# Patient Record
Sex: Male | Born: 1995 | Race: White | Hispanic: No | Marital: Single | Smoking: Current some day smoker
Health system: Southern US, Community
[De-identification: ages and names within clinical notes are randomized; demographics above are authoritative.]

## PROBLEM LIST (undated history)

## (undated) DIAGNOSIS — L709 Acne, unspecified: Secondary | ICD-10-CM

## (undated) DIAGNOSIS — J302 Other seasonal allergic rhinitis: Secondary | ICD-10-CM

## (undated) HISTORY — DX: Acne, unspecified: L70.9

## (undated) HISTORY — DX: Other seasonal allergic rhinitis: J30.2

---
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
## 2015-06-08 ENCOUNTER — Encounter: Payer: Self-pay | Admitting: Podiatry

## 2015-06-08 ENCOUNTER — Ambulatory Visit: Payer: BLUE CROSS/BLUE SHIELD | Admitting: Podiatry

## 2015-06-08 ENCOUNTER — Ambulatory Visit (INDEPENDENT_AMBULATORY_CARE_PROVIDER_SITE_OTHER): Payer: BLUE CROSS/BLUE SHIELD | Admitting: Podiatry

## 2015-06-08 VITALS — BP 120/80 | HR 88 | Resp 16

## 2015-06-08 DIAGNOSIS — L600 Ingrowing nail: Secondary | ICD-10-CM | POA: Diagnosis not present

## 2015-06-08 MED ORDER — NEOMYCIN-POLYMYXIN-HC 1 % OT SOLN: OTIC | Status: DC

## 2015-06-08 MED ORDER — HYDROCODONE-ACETAMINOPHEN 10-325 MG PO TABS: 1.0000 | ORAL_TABLET | Freq: Three times a day (TID) | ORAL | Status: DC | PRN

## 2015-06-08 NOTE — Progress Notes (Signed)
Subjective:     Patient ID: Stephen Williams, male   DOB: 06/15/1995, 20 y.o.   MRN: [id number]  HPI patient presents with his mother with chronic ingrown toenails and thick toenails of both feet that have been a problem for him. 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 They have opted for permanent procedures and if this does not work we'll need to consider suturing the areas closed. At this time I infiltrated each hallux 60 mg Xylocaine Marcaine mixture remove the hallux nails exposed the root and applied phenol 5 applications 30 seconds and applied sterile dressings to each hallux for Corticosporin otic solution and Vicodin 10/31/2023 #10 for postoperative care. Instructed on soaks and reappoint

## 2015-06-08 NOTE — Patient Instructions (Signed)

## 2015-06-16 ENCOUNTER — Telehealth: Payer: Self-pay | Admitting: *Deleted

## 2015-06-16 NOTE — Telephone Encounter (Signed)
Called patient at [phone] (Home #) to check to see how they were doing from their bilateral ingrown toenail procedure that was performed on Thursday, Jun 08, 2015. Patient's mother stated, "Son is doing fine and has no pain". I told her that if they have any questions or concerns to call our office back at any time. She stated she understood.

## 2015-07-17 ENCOUNTER — Telehealth: Payer: Self-pay | Admitting: Internal Medicine

## 2015-07-17 NOTE — Telephone Encounter (Signed)
Got scheduled  °

## 2015-07-17 NOTE — Telephone Encounter (Signed)
yes

## 2015-07-17 NOTE — Telephone Encounter (Signed)
Would like to know if Dr. Jones can take patient on.  °

## 2015-07-27 ENCOUNTER — Encounter: Payer: Self-pay | Admitting: Internal Medicine

## 2015-07-27 ENCOUNTER — Ambulatory Visit (INDEPENDENT_AMBULATORY_CARE_PROVIDER_SITE_OTHER): Payer: BLUE CROSS/BLUE SHIELD | Admitting: Internal Medicine

## 2015-07-27 ENCOUNTER — Other Ambulatory Visit (INDEPENDENT_AMBULATORY_CARE_PROVIDER_SITE_OTHER): Payer: BLUE CROSS/BLUE SHIELD

## 2015-07-27 VITALS — BP 118/80 | HR 77 | Temp 98.5°F | Resp 16 | Ht 69.0 in | Wt 156.0 lb

## 2015-07-27 DIAGNOSIS — Z0000 Encounter for general adult medical examination without abnormal findings: Secondary | ICD-10-CM

## 2015-07-27 DIAGNOSIS — F900 Attention-deficit hyperactivity disorder, predominantly inattentive type: Secondary | ICD-10-CM | POA: Insufficient documentation

## 2015-07-27 LAB — CBC WITH DIFFERENTIAL/PLATELET
BASOS ABS: 0.1 10*3/uL (ref 0.0–0.1)
Basophils Relative: 0.8 % (ref 0.0–3.0)
EOS ABS: 0.2 10*3/uL (ref 0.0–0.7)
Eosinophils Relative: 3 % (ref 0.0–5.0)
HCT: 46.2 % (ref 36.0–49.0)
Hemoglobin: 15.4 g/dL (ref 12.0–16.0)
LYMPHS ABS: 2.2 10*3/uL (ref 0.7–4.0)
Lymphocytes Relative: 31.5 % (ref 24.0–48.0)
MCHC: 33.4 g/dL (ref 31.0–37.0)
MCV: 89.7 fl (ref 78.0–98.0)
MONOS PCT: 9.3 % (ref 3.0–12.0)
Monocytes Absolute: 0.6 10*3/uL (ref 0.1–1.0)
NEUTROS ABS: 3.8 10*3/uL (ref 1.4–7.7)
NEUTROS PCT: 55.4 % (ref 43.0–71.0)
PLATELETS: 209 10*3/uL (ref 150.0–575.0)
RBC: 5.15 Mil/uL (ref 3.80–5.70)
RDW: 13.4 % (ref 11.4–15.5)
WBC: 6.8 10*3/uL (ref 4.5–13.5)

## 2015-07-27 LAB — LIPID PANEL
CHOL/HDL RATIO: 4
Cholesterol: 200 mg/dL (ref 0–200)
HDL: 49.5 mg/dL (ref >39.00)
LDL Cholesterol: 136 mg/dL — ABNORMAL HIGH (ref 0–99)
NonHDL: 150.25
TRIGLYCERIDES: 71 mg/dL (ref 0.0–149.0)
VLDL: 14.2 mg/dL (ref 0.0–40.0)

## 2015-07-27 LAB — COMPREHENSIVE METABOLIC PANEL
ALK PHOS: 74 U/L (ref 52–171)
ALT: 13 U/L (ref 0–53)
AST: 12 U/L (ref 0–37)
Albumin: 4.7 g/dL (ref 3.5–5.2)
BILIRUBIN TOTAL: 0.8 mg/dL (ref 0.2–1.2)
BUN: 12 mg/dL (ref 6–23)
CALCIUM: 10.4 mg/dL (ref 8.4–10.5)
CO2: 31 meq/L (ref 19–32)
CREATININE: 0.89 mg/dL (ref 0.40–1.50)
Chloride: 104 mEq/L (ref 96–112)
GFR: 116.26 mL/min (ref >60.00)
GLUCOSE: 89 mg/dL (ref 70–99)
Potassium: 4.4 mEq/L (ref 3.5–5.1)
Sodium: 141 mEq/L (ref 135–145)
TOTAL PROTEIN: 7.6 g/dL (ref 6.0–8.3)

## 2015-07-27 LAB — TSH: TSH: 1.53 u[IU]/mL (ref 0.40–5.00)

## 2015-07-27 MED ORDER — AMPHETAMINE-DEXTROAMPHET ER 20 MG PO CP24: 20.0000 mg | ORAL_CAPSULE | ORAL | Status: DC

## 2015-07-27 NOTE — Patient Instructions (Signed)

## 2015-07-27 NOTE — Progress Notes (Signed)
Subjective:  Patient ID: Stephen Williams, male    DOB: 06/12/1995  Age: 20 y.o. MRN: [id number]  CC: ADHD and Annual Exam  NEW TO ME  HPI Stephen EgeJoseph Andrew Russman presents for a CPX.  He requests to be treated for ADHD with poor attention. See scanned documents from [facility] that document that he has ADHD and may benefit from medication. He tells me that a few months ago he tried to friends dose of Adderall and he tells me that it helped him quite a bit. 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 He has not decided on a major yet and says his grades are "okay".  History Stephen Williams has a past medical history of Acne and Seasonal allergies.   He has no past surgical history on file.   His family history includes Cancer in his paternal grandfather.He reports that he has been smoking.  He has never used smokeless tobacco. He reports that he does not drink alcohol or use illicit drugs.  Outpatient Prescriptions Prior to Visit  Medication Sig Dispense Refill  . HYDROcodone-acetaminophen (NORCO) 10-325 MG tablet Take 1 tablet by mouth every 8 (eight) hours as needed. (Patient not taking: Reported on 07/27/2015) 10 tablet 0  . NEOMYCIN-POLYMYXIN-HYDROCORTISONE (CORTISPORIN) 1 % SOLN otic solution Apply 1-2 drops to toe BID after soaking (Patient not taking: Reported on 07/27/2015) 10 mL 1   No facility-administered medications prior to visit.    ROS Review of Systems  Constitutional: Negative.  Negative for activity change, appetite change, fatigue and unexpected weight change.  HENT: Negative.   Eyes: Negative.   Respiratory: Negative.  Negative for cough and shortness of breath.   Cardiovascular: Negative.   Gastrointestinal: Negative.  Negative for abdominal pain.  Endocrine: Negative.   Genitourinary: Negative.  Negative for dysuria, urgency, decreased urine  volume, discharge, penile swelling, scrotal swelling, difficulty urinating, genital sores, penile pain and testicular pain.  Musculoskeletal: Negative.   Skin: Negative.  Negative for rash.  Allergic/Immunologic: Negative.   Neurological: Negative.   Hematological: Negative.   Psychiatric/Behavioral: Positive for decreased concentration. Negative for suicidal ideas, hallucinations, behavioral problems, confusion, sleep disturbance, self-injury, dysphoric mood and agitation. The patient is not nervous/anxious and is not hyperactive.     Objective:  BP 118/80 mmHg  Pulse 77  Temp(Src) 98.5 F (36.9 C) (Oral)  Resp 16  Ht 5\' 9"  (1.753 m)  Wt 156 lb (70.761 kg)  BMI 23.03 kg/m2  SpO2 96%  Physical Exam  Constitutional: He is oriented to person, place, and time. No distress.  HENT:  Mouth/Throat: Oropharynx is clear and moist. No oropharyngeal exudate.  Eyes: Conjunctivae are normal. Right eye exhibits no discharge. Left eye exhibits no discharge. No scleral icterus.  Neck: Normal range of motion. Neck supple. No JVD present. No tracheal deviation present. No thyromegaly present.  Cardiovascular: Normal rate, regular rhythm, normal heart sounds and intact distal pulses.  Exam reveals no gallop and no friction rub.   No murmur heard. Pulmonary/Chest: Effort normal. No respiratory distress. He has no wheezes. He has no rales. He exhibits no tenderness.  Abdominal: Soft. Bowel sounds are normal. He exhibits no distension and no mass. There is no tenderness. There is no rebound and no guarding. Hernia confirmed negative in the right inguinal area and confirmed negative in the left inguinal area.  Genitourinary: Testes normal and penis normal. Right  testis shows no mass, no swelling and no tenderness. Right testis is descended. Left testis shows no mass, no swelling and no tenderness. Left testis is descended. Circumcised. No penile erythema or penile tenderness. No discharge found.    Musculoskeletal: Normal range of motion. He exhibits no edema or tenderness.  Lymphadenopathy:    He has no cervical adenopathy.       Right: No inguinal adenopathy present.       Left: No inguinal adenopathy present.  Neurological: He is oriented to person, place, and time.  Skin: Skin is warm and dry. No rash noted. He is not diaphoretic. No erythema. No pallor.  Psychiatric: He has a normal mood and affect. His behavior is normal. Judgment and thought content normal.  Vitals reviewed.    Lab Results  Component Value Date   WBC 6.8 07/27/2015   HGB 15.4 07/27/2015   HCT 46.2 07/27/2015   PLT 209.0 07/27/2015   GLUCOSE 89 07/27/2015   CHOL 200 07/27/2015   TRIG 71.0 07/27/2015   HDL 49.50 07/27/2015   LDLCALC 136* 07/27/2015   ALT 13 07/27/2015   AST 12 07/27/2015   NA 141 07/27/2015   K 4.4 07/27/2015   CL 104 07/27/2015   CREATININE 0.89 07/27/2015   BUN 12 07/27/2015   CO2 31 07/27/2015   TSH 1.53 07/27/2015   Assessment & Plan:   Stephen Williams was seen today for adhd and annual exam.  Diagnoses and all orders for this visit:  Attention deficit hyperactivity disorder (ADHD), predominantly inattentive type -     Discontinue: amphetamine-dextroamphetamine (ADDERALL XR) 20 MG 24 hr capsule; Take 1 capsule (20 mg total) by mouth every morning. -     Discontinue: amphetamine-dextroamphetamine (ADDERALL XR) 20 MG 24 hr capsule; Take 1 capsule (20 mg total) by mouth every morning. -     Discontinue: amphetamine-dextroamphetamine (ADDERALL XR) 20 MG 24 hr capsule; Take 1 capsule (20 mg total) by mouth every morning. -     amphetamine-dextroamphetamine (ADDERALL XR) 20 MG 24 hr capsule; Take 1 capsule (20 mg total) by mouth every morning.  Routine general medical examination at a health care facility- hand complete, labs ordered and reviewed, vaccines were reviewed, patient education material was given. -     Lipid panel; Future -     Comprehensive metabolic panel; Future -      CBC with Differential/Platelet; Future -     TSH; Future -     HIV antibody; Future  I have discontinued Mr. Louie Williams's NEOMYCIN-POLYMYXIN-HYDROCORTISONE and HYDROcodone-acetaminophen. I am also having him maintain his amphetamine-dextroamphetamine.  Meds ordered this encounter  Medications  . DISCONTD: amphetamine-dextroamphetamine (ADDERALL XR) 20 MG 24 hr capsule    Sig: Take 1 capsule (20 mg total) by mouth every morning.    Dispense:  30 capsule    Refill:  0    FILL ON OR AFTER 07/27/15  . DISCONTD: amphetamine-dextroamphetamine (ADDERALL XR) 20 MG 24 hr capsule    Sig: Take 1 capsule (20 mg total) by mouth every morning.    Dispense:  30 capsule    Refill:  0    FILL ON OR AFTER 08/26/15  . DISCONTD: amphetamine-dextroamphetamine (ADDERALL XR) 20 MG 24 hr capsule    Sig: Take 1 capsule (20 mg total) by mouth every morning.    Dispense:  30 capsule    Refill:  0    FILL ON OR AFTER 09/26/15  . amphetamine-dextroamphetamine (ADDERALL XR) 20 MG 24 hr capsule  Sig: Take 1 capsule (20 mg total) by mouth every morning.    Dispense:  30 capsule    Refill:  0    FILL ON OR AFTER 10/27/15     Follow-up: Return if symptoms worsen or fail to improve.  Sanda Linger, MD

## 2015-07-27 NOTE — Progress Notes (Signed)
Pre visit review using our clinic review tool, if applicable. No additional management support is needed unless otherwise documented below in the visit note. 

## 2015-07-28 ENCOUNTER — Encounter: Payer: Self-pay | Admitting: Internal Medicine

## 2015-07-28 LAB — HIV ANTIBODY (ROUTINE TESTING W REFLEX): HIV: NONREACTIVE

## 2015-08-02 ENCOUNTER — Encounter: Payer: Self-pay | Admitting: Internal Medicine

## 2015-10-19 ENCOUNTER — Ambulatory Visit (INDEPENDENT_AMBULATORY_CARE_PROVIDER_SITE_OTHER): Payer: BLUE CROSS/BLUE SHIELD | Admitting: Internal Medicine

## 2015-10-19 ENCOUNTER — Encounter: Payer: Self-pay | Admitting: Internal Medicine

## 2015-10-19 VITALS — BP 128/70 | HR 89 | Temp 98.7°F | Wt 147.0 lb

## 2015-10-19 DIAGNOSIS — K5900 Constipation, unspecified: Secondary | ICD-10-CM | POA: Insufficient documentation

## 2015-10-19 DIAGNOSIS — F900 Attention-deficit hyperactivity disorder, predominantly inattentive type: Secondary | ICD-10-CM

## 2015-10-19 DIAGNOSIS — K645 Perianal venous thrombosis: Secondary | ICD-10-CM | POA: Insufficient documentation

## 2015-10-19 MED ORDER — AMPHETAMINE-DEXTROAMPHET ER 20 MG PO CP24: 20.0000 mg | ORAL_CAPSULE | ORAL | 0 refills | Status: DC

## 2015-10-19 MED ORDER — LIDOCAINE-HYDROCORTISONE ACE 3-0.5 % RE CREA: 1.0000 | TOPICAL_CREAM | Freq: Two times a day (BID) | RECTAL | 1 refills | Status: DC

## 2015-10-19 NOTE — Patient Instructions (Signed)
Please take all new medication as prescribed - the anamantle HC cream for anti-inflammation and pain  OK to start the miralax 17 gm by mouth daily, as well as the Colace 100 mg twice per day as needed for softening  Please continue all other medications as before, and refills have been done if requested - the adderall  Please have the pharmacy call with any other refills you may need.  Please keep your appointments with your specialists as you may have planned

## 2015-10-19 NOTE — Progress Notes (Signed)
   Subjective:    Patient ID: Andres EgeJoseph Andrew Eudy, male    DOB: 02/26/1995, 20 y.o.   MRN: [id number]  HPI  Here with mother, c/o 4 days onset rectal pain, seemed to start with assoc with strain at stool related to recent flare of acute on chronic constipation, pain some improved today but still 3/10, no bleeding or other abd pain, n/v, fever, drainage or hx of same.  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 No wt loss or other side effect. Past Medical History:  Diagnosis Date  . Acne   . Seasonal allergies    No past surgical history on file.  reports that he has been smoking.  He has never used smokeless tobacco. He reports that he does not drink alcohol or use drugs. family history includes Cancer in his paternal grandfather. No Known Allergies No current outpatient prescriptions on file prior to visit.   No current facility-administered medications on file prior to visit.    Review of Systems  Constitutional: Negative for unusual diaphoresis or night sweats HENT: Negative for ear swelling or discharge Eyes: Negative for worsening visual haziness  Respiratory: Negative for choking and stridor.   Gastrointestinal: Negative for distension or worsening eructation Genitourinary: Negative for retention or change in urine volume.  Musculoskeletal: Negative for other MSK pain or swelling Skin: Negative for color change and worsening wound Neurological: Negative for tremors and numbness other than noted  Psychiatric/Behavioral: Negative for decreased concentration or agitation other than above       Objective:   Physical Exam BP 128/70   Pulse 89   Temp 98.7 F (37.1 C) (Oral)   Wt 147 lb (66.7 kg)   SpO2 96%   BMI 21.71 kg/m  VS noted,  Constitutional: Pt appears in no apparent distress HENT: Head: NCAT.  Right Ear: External ear normal.  Left Ear: External ear normal.  Eyes:  . Pupils are equal, round, and reactive to light. Conjunctivae and EOM are normal Neck: Normal range of motion. Neck supple.  Cardiovascular: Normal rate and regular rhythm.   Pulmonary/Chest: Effort normal and breath sounds without rales or wheezing.  Abd:  Soft, NT, ND, + BS DRE with 4oclock 15 mm thrombosed ext hemorrhoid with slight blood 2mm area nonactive bleeding Neurological: Pt is alert. Not confused , motor grossly intact Skin: Skin is warm. No rash, no LE edema Psychiatric: Pt behavior is normal. No agitation.     Assessment & Plan:

## 2015-10-19 NOTE — Progress Notes (Signed)
Pre visit review using our clinic review tool, if applicable. No additional management support is needed unless otherwise documented below in the visit note. 

## 2015-10-20 ENCOUNTER — Ambulatory Visit: Payer: BLUE CROSS/BLUE SHIELD | Admitting: Internal Medicine

## 2015-10-21 NOTE — Assessment & Plan Note (Signed)
Mild to mod, for anamantle HC prn,,  to f/u any worsening symptoms or concerns

## 2015-10-21 NOTE — Assessment & Plan Note (Addendum)
Ok for refill current med,  No evidence for diversion or misuse, to f/u any worsening symptoms or concerns

## 2015-10-21 NOTE — Assessment & Plan Note (Signed)
Acute on chronic recurring, for miralax daily restart

## 2016-01-11 ENCOUNTER — Other Ambulatory Visit: Payer: Self-pay | Admitting: Internal Medicine

## 2016-01-11 ENCOUNTER — Telehealth: Payer: Self-pay | Admitting: *Deleted

## 2016-01-11 DIAGNOSIS — F900 Attention-deficit hyperactivity disorder, predominantly inattentive type: Secondary | ICD-10-CM

## 2016-01-11 MED ORDER — AMPHETAMINE-DEXTROAMPHET ER 20 MG PO CP24: 20.0000 mg | ORAL_CAPSULE | ORAL | 0 refills | Status: DC

## 2016-01-11 NOTE — Telephone Encounter (Signed)
done

## 2016-01-11 NOTE — Telephone Encounter (Signed)
Notified pt mom rx ready for pick-up.../lmb 

## 2016-01-11 NOTE — Telephone Encounter (Signed)
Rec'd call from pt mom son needing refill on Adderrall...Raechel Chute/lmb

## 2016-03-07 ENCOUNTER — Other Ambulatory Visit: Payer: Self-pay | Admitting: Internal Medicine

## 2016-03-07 ENCOUNTER — Telehealth: Payer: Self-pay | Admitting: *Deleted

## 2016-03-07 DIAGNOSIS — F900 Attention-deficit hyperactivity disorder, predominantly inattentive type: Secondary | ICD-10-CM

## 2016-03-07 MED ORDER — AMPHETAMINE-DEXTROAMPHET ER 20 MG PO CP24: 20.0000 mg | ORAL_CAPSULE | ORAL | 0 refills | Status: DC

## 2016-03-07 NOTE — Telephone Encounter (Signed)
Notified pt mom rx's ready for pick-up...Stephen Williams/lmb

## 2016-03-07 NOTE — Telephone Encounter (Signed)
Rec'd call from pt mom requesting refills on Adderrall for son...Stephen Williams/lmb

## 2016-03-07 NOTE — Telephone Encounter (Signed)
done

## 2016-06-10 ENCOUNTER — Ambulatory Visit (INDEPENDENT_AMBULATORY_CARE_PROVIDER_SITE_OTHER): Payer: BLUE CROSS/BLUE SHIELD | Admitting: Family

## 2016-06-10 ENCOUNTER — Encounter: Payer: Self-pay | Admitting: Family

## 2016-06-10 DIAGNOSIS — F900 Attention-deficit hyperactivity disorder, predominantly inattentive type: Secondary | ICD-10-CM

## 2016-06-10 MED ORDER — AMPHETAMINE-DEXTROAMPHET ER 20 MG PO CP24: 20.0000 mg | ORAL_CAPSULE | ORAL | 0 refills | Status: DC

## 2016-06-10 NOTE — Progress Notes (Signed)
Subjective:    Patient ID: Stephen Williams, male    DOB: December 23, 1995, 20 y.o.   MRN: [id number]  Chief Complaint  Patient presents with  . Medication Refill    needs refill of adderall    HPI:  Stephen Williams is a 21 y.o. male who  has a past medical history of Acne and Seasonal allergies. and presents today for an a follow up office visit.  1.) ADHD - Currently maintained on Adderall and reports taking the medication as prescribed and denies adverse side effects. Concentration and symptoms are generally well controlled with the current dosage of medication. Sleeping approximately 6-8 hours per night. No significant changes to appetite or weight. No headaches, chest pain, shortness of breath, or heart palpitations.   Wt Readings from Last 3 Encounters:  06/10/16 155 lb 6.4 oz (70.5 kg)  10/19/15 147 lb (66.7 kg) (36 %, Z= -0.35)*  07/27/15 156 lb (70.8 kg) (52 %, Z= 0.05)*   * Growth percentiles are based on CDC 2-20 Years data.    No Known Allergies    Outpatient Medications Prior to Visit  Medication Sig Dispense Refill  . amphetamine-dextroamphetamine (ADDERALL XR) 20 MG 24 hr capsule Take 1 capsule (20 mg total) by mouth every morning. 30 capsule 0  . lidocaine-hydrocortisone (ANAMANTEL HC) 3-0.5 % CREA Place 1 Applicatorful rectally 2 (two) times daily. 28.3 g 1   No facility-administered medications prior to visit.      Review of Systems  Constitutional: Negative for appetite change, diaphoresis, fatigue, fever and unexpected weight change.  Respiratory: Negative for chest tightness and shortness of breath.   Cardiovascular: Negative for chest pain, palpitations and leg swelling.  Neurological: Negative for dizziness and light-headedness.  Psychiatric/Behavioral: Negative for decreased concentration and sleep disturbance. The patient is not nervous/anxious and is not hyperactive.       Objective:    BP 132/82 (BP Location: Right Arm, Patient Position:  Sitting, Cuff Size: Normal)   Pulse 92   Temp 98.1 F (36.7 C) (Oral)   Resp 16   Ht 5\' 9"  (1.753 m)   Wt 155 lb 6.4 oz (70.5 kg)   SpO2 98%   BMI 22.95 kg/m  Nursing note and vital signs reviewed.  Physical Exam  Constitutional: He is oriented to person, place, and time. He appears well-developed and well-nourished. No distress.  Cardiovascular: Normal rate, regular rhythm, normal heart sounds and intact distal pulses.   Pulmonary/Chest: Effort normal and breath sounds normal.  Neurological: He is alert and oriented to person, place, and time.  Skin: Skin is warm and dry.  Psychiatric: He has a normal mood and affect. His behavior is normal. Judgment and thought content normal.       Assessment & Plan:   Problem List Items Addressed This Visit      Other   Attention deficit hyperactivity disorder (ADHD), predominantly inattentive type    ADHD appears stable with current medication regimen and no adverse side effects. Sleeping and eating well. No cardiac symptoms. [place] controlled substance database reviewed with no irregularities. Continue current dosage of Adderall XR.      Relevant Medications   amphetamine-dextroamphetamine (ADDERALL XR) 20 MG 24 hr capsule       I have discontinued Mr. Tremblay's lidocaine-hydrocortisone. I am also having him maintain his amphetamine-dextroamphetamine.   Meds ordered this encounter  Medications  . DISCONTD: amphetamine-dextroamphetamine (ADDERALL XR) 20 MG 24 hr capsule    Sig: Take 1 capsule (20 mg  total) by mouth every morning.    Dispense:  30 capsule    Refill:  0    Order Specific Question:   Supervising Provider    Answer:   Hillard DankerRAWFORD, ELIZABETH A [[id number]]  . amphetamine-dextroamphetamine (ADDERALL XR) 20 MG 24 hr capsule    Sig: Take 1 capsule (20 mg total) by mouth every morning.    Dispense:  30 capsule    Refill:  0    Fill on or after 07/10/16    Order Specific Question:   Supervising Provider    Answer:    Hillard DankerRAWFORD, ELIZABETH A [[id number]]     Follow-up: Return if symptoms worsen or fail to improve.  Jeanine Luzalone, Gregory, FNP

## 2016-06-10 NOTE — Patient Instructions (Signed)
Thank you for choosing [facility].  SUMMARY AND INSTRUCTIONS:  Please continue to take your medication as prescribed.   Medication:  Your prescription(s) have been submitted to your pharmacy or been printed and provided for you. Please take as directed and contact our office if you believe you are having problem(s) with the medication(s) or have any questions.  Follow up:  If your symptoms worsen or fail to improve, please contact our office for further instruction, or in case of emergency go directly to the emergency room at the closest medical facility.     

## 2016-06-10 NOTE — Assessment & Plan Note (Signed)
ADHD appears stable with current medication regimen and no adverse side effects. Sleeping and eating well. No cardiac symptoms. [place] controlled substance database reviewed with no irregularities. Continue current dosage of Adderall XR.

## 2016-07-09 ENCOUNTER — Encounter: Payer: BLUE CROSS/BLUE SHIELD | Admitting: Internal Medicine

## 2016-07-11 ENCOUNTER — Ambulatory Visit: Payer: BLUE CROSS/BLUE SHIELD | Admitting: Internal Medicine

## 2016-08-06 ENCOUNTER — Ambulatory Visit (INDEPENDENT_AMBULATORY_CARE_PROVIDER_SITE_OTHER): Payer: BLUE CROSS/BLUE SHIELD | Admitting: Internal Medicine

## 2016-08-06 ENCOUNTER — Other Ambulatory Visit (INDEPENDENT_AMBULATORY_CARE_PROVIDER_SITE_OTHER): Payer: BLUE CROSS/BLUE SHIELD

## 2016-08-06 ENCOUNTER — Encounter: Payer: Self-pay | Admitting: Internal Medicine

## 2016-08-06 VITALS — BP 100/60 | HR 79 | Temp 98.2°F | Resp 16 | Ht 69.0 in | Wt 150.8 lb

## 2016-08-06 DIAGNOSIS — Z0000 Encounter for general adult medical examination without abnormal findings: Secondary | ICD-10-CM | POA: Diagnosis not present

## 2016-08-06 DIAGNOSIS — Z23 Encounter for immunization: Secondary | ICD-10-CM

## 2016-08-06 DIAGNOSIS — F900 Attention-deficit hyperactivity disorder, predominantly inattentive type: Secondary | ICD-10-CM | POA: Diagnosis not present

## 2016-08-06 LAB — LIPID PANEL
CHOL/HDL RATIO: 4
Cholesterol: 165 mg/dL (ref 0–200)
HDL: 46 mg/dL (ref >39.00)
LDL Cholesterol: 107 mg/dL — ABNORMAL HIGH (ref 0–99)
NONHDL: 119.08
Triglycerides: 61 mg/dL (ref 0.0–149.0)
VLDL: 12.2 mg/dL (ref 0.0–40.0)

## 2016-08-06 MED ORDER — AMPHETAMINE-DEXTROAMPHET ER 20 MG PO CP24: 20.0000 mg | ORAL_CAPSULE | ORAL | 0 refills | Status: DC

## 2016-08-06 NOTE — Patient Instructions (Signed)

## 2016-08-07 NOTE — Progress Notes (Signed)
Subjective:  Patient ID: Stephen Williams, male    DOB: August 11, 1995  Age: 21 y.o. MRN: [id number]  CC: Annual Exam and ADHD   HPI Stephen Williams presents for a CPX.   Pt states ADD status overall stable on current meds with overall good compliance and tolerability, and good effectiveness with respect to ability for concentration and task completion.  Outpatient Medications Prior to Visit  Medication Sig Dispense Refill  . amphetamine-dextroamphetamine (ADDERALL XR) 20 MG 24 hr capsule Take 1 capsule (20 mg total) by mouth every morning. 30 capsule 0   No facility-administered medications prior to visit.     ROS Review of Systems  Constitutional: Negative.  Negative for activity change, appetite change, diaphoresis, fatigue and unexpected weight change.  HENT: Negative.   Eyes: Negative for visual disturbance.  Respiratory: Negative for chest tightness and shortness of breath.   Cardiovascular: Negative.  Negative for chest pain, palpitations and leg swelling.  Gastrointestinal: Negative for abdominal pain, diarrhea, nausea and vomiting.  Endocrine: Negative.   Genitourinary: Negative for difficulty urinating, discharge, dysuria, genital sores, scrotal swelling, testicular pain and urgency.  Musculoskeletal: Negative.   Skin: Negative.   Allergic/Immunologic: Negative.   Neurological: Negative.   Hematological: Negative for adenopathy. Does not bruise/bleed easily.  Psychiatric/Behavioral: Positive for decreased concentration. Negative for agitation, behavioral problems, confusion, dysphoric mood, self-injury, sleep disturbance and suicidal ideas. The patient is not nervous/anxious.     Objective:  BP 100/60 (BP Location: Left Arm, Patient Position: Sitting, Cuff Size: Normal)   Pulse 79   Temp 98.2 F (36.8 C) (Oral)   Resp 16   Ht 5\' 9"  (1.753 m)   Wt 150 lb 12 oz (68.4 kg)   SpO2 100%   BMI 22.26 kg/m   BP Readings from Last 3 Encounters:  08/06/16 100/60   06/10/16 132/82  10/19/15 128/70    Wt Readings from Last 3 Encounters:  08/06/16 150 lb 12 oz (68.4 kg)  06/10/16 155 lb 6.4 oz (70.5 kg)  10/19/15 147 lb (66.7 kg) (36 %, Z= -0.35)*   * Growth percentiles are based on CDC 2-20 Years data.    Physical Exam  Constitutional: He is oriented to person, place, and time.  HENT:  Mouth/Throat: Oropharynx is clear and moist. No oropharyngeal exudate.  Eyes: Conjunctivae are normal. Right eye exhibits no discharge. Left eye exhibits no discharge. No scleral icterus.  Neck: Normal range of motion. Neck supple. No JVD present. No tracheal deviation present. No thyromegaly present.  Cardiovascular: Normal rate, regular rhythm and intact distal pulses.  Exam reveals no gallop and no friction rub.   No murmur heard. Pulmonary/Chest: Effort normal and breath sounds normal. No respiratory distress. He has no wheezes. He has no rales. He exhibits no tenderness.  Abdominal: Soft. Bowel sounds are normal. He exhibits no distension and no mass. There is no tenderness. There is no rebound and no guarding. Hernia confirmed negative in the right inguinal area and confirmed negative in the left inguinal area.  Genitourinary: Testes normal and penis normal. Right testis shows no mass, no swelling and no tenderness. Right testis is descended. Left testis shows no mass, no swelling and no tenderness. Left testis is descended. Circumcised. No penile erythema or penile tenderness. No discharge found.  Musculoskeletal: Normal range of motion. He exhibits no edema or tenderness.  Lymphadenopathy:    He has no cervical adenopathy.       Right: No inguinal adenopathy present.  Left: No inguinal adenopathy present.  Neurological: He is alert and oriented to person, place, and time.  Skin: Skin is warm and dry. No rash noted. He is not diaphoretic. No erythema. No pallor.  Psychiatric: He has a normal mood and affect. His behavior is normal. Judgment and thought  content normal.  Vitals reviewed.   Lab Results  Component Value Date   WBC 6.8 07/27/2015   HGB 15.4 07/27/2015   HCT 46.2 07/27/2015   PLT 209.0 07/27/2015   GLUCOSE 89 07/27/2015   CHOL 165 08/06/2016   TRIG 61.0 08/06/2016   HDL 46.00 08/06/2016   LDLCALC 107 (H) 08/06/2016   ALT 13 07/27/2015   AST 12 07/27/2015   NA 141 07/27/2015   K 4.4 07/27/2015   CL 104 07/27/2015   CREATININE 0.89 07/27/2015   BUN 12 07/27/2015   CO2 31 07/27/2015   TSH 1.53 07/27/2015    No results found.  Assessment & Plan:   Stephen Williams was seen today for annual exam and adhd.  Diagnoses and all orders for this visit:  Attention deficit hyperactivity disorder (ADHD), predominantly inattentive type- he is doing well on the current dose of Adderall, will continue at this dose. -     Discontinue: amphetamine-dextroamphetamine (ADDERALL XR) 20 MG 24 hr capsule; Take 1 capsule (20 mg total) by mouth every morning. -     Discontinue: amphetamine-dextroamphetamine (ADDERALL XR) 20 MG 24 hr capsule; Take 1 capsule (20 mg total) by mouth every morning. -     Discontinue: amphetamine-dextroamphetamine (ADDERALL XR) 20 MG 24 hr capsule; Take 1 capsule (20 mg total) by mouth every morning. -     amphetamine-dextroamphetamine (ADDERALL XR) 20 MG 24 hr capsule; Take 1 capsule (20 mg total) by mouth every morning.  Routine general medical examination at a health care facility- Exam completed, labs reviewed, vaccines reviewed and updated, patient education material was given. -     Lipid panel; Future  Need for Tdap vaccination -     Tdap vaccine greater than or equal to 7yo IM   I am having Stephen Williams maintain his amphetamine-dextroamphetamine.  Meds ordered this encounter  Medications  . DISCONTD: amphetamine-dextroamphetamine (ADDERALL XR) 20 MG 24 hr capsule    Sig: Take 1 capsule (20 mg total) by mouth every morning.    Dispense:  30 capsule    Refill:  0    Fill on or after 08/06/16  . DISCONTD:  amphetamine-dextroamphetamine (ADDERALL XR) 20 MG 24 hr capsule    Sig: Take 1 capsule (20 mg total) by mouth every morning.    Dispense:  30 capsule    Refill:  0    Fill on or after 09/06/16  . DISCONTD: amphetamine-dextroamphetamine (ADDERALL XR) 20 MG 24 hr capsule    Sig: Take 1 capsule (20 mg total) by mouth every morning.    Dispense:  30 capsule    Refill:  0    Fill on or after 10/07/16  . amphetamine-dextroamphetamine (ADDERALL XR) 20 MG 24 hr capsule    Sig: Take 1 capsule (20 mg total) by mouth every morning.    Dispense:  30 capsule    Refill:  0    Fill on or after 11/06/16     Follow-up: Return if symptoms worsen or fail to improve.  Sanda Lingerhomas Tiye Huwe, MD

## 2016-08-08 ENCOUNTER — Encounter: Payer: BLUE CROSS/BLUE SHIELD | Admitting: Internal Medicine

## 2017-06-24 DIAGNOSIS — M79673 Pain in unspecified foot: Secondary | ICD-10-CM | POA: Insufficient documentation

## 2017-06-25 ENCOUNTER — Encounter: Payer: Self-pay | Admitting: Internal Medicine

## 2017-06-25 ENCOUNTER — Ambulatory Visit: Payer: BLUE CROSS/BLUE SHIELD | Admitting: Internal Medicine

## 2017-06-25 VITALS — BP 112/62 | HR 88 | Temp 98.4°F | Resp 16 | Ht 69.0 in | Wt 165.0 lb

## 2017-06-25 DIAGNOSIS — F900 Attention-deficit hyperactivity disorder, predominantly inattentive type: Secondary | ICD-10-CM | POA: Diagnosis not present

## 2017-06-25 MED ORDER — ATOMOXETINE HCL 10 MG PO CAPS: 10.0000 mg | ORAL_CAPSULE | Freq: Every day | ORAL | 0 refills | Status: DC

## 2017-06-25 MED ORDER — ATOMOXETINE HCL 40 MG PO CAPS: 40.0000 mg | ORAL_CAPSULE | Freq: Every day | ORAL | 1 refills | Status: DC

## 2017-06-25 MED ORDER — ATOMOXETINE HCL 25 MG PO CAPS: 25.0000 mg | ORAL_CAPSULE | Freq: Every day | ORAL | 0 refills | Status: DC

## 2017-06-25 MED ORDER — ATOMOXETINE HCL 18 MG PO CAPS: 18.0000 mg | ORAL_CAPSULE | Freq: Every day | ORAL | 0 refills | Status: DC

## 2017-06-25 NOTE — Patient Instructions (Signed)

## 2017-06-25 NOTE — Progress Notes (Signed)
Subjective:  Patient ID: Stephen Williams, male    DOB: Oct 21, 1995  Age: 22 y.o. MRN: [id number]  CC: ADHD   HPI Stephen Williams presents for f/up -he does not want to take Adderall anymore.  When he takes it he feels jittery, anxious, and has trouble sleeping. If he misses a dose for a day or 2 he sinks into a severe depression.  He feels like he needs something to help with his focus, concentration, and task completion but he just does not think a stimulant like Adderall is right for him.  Outpatient Medications Prior to Visit  Medication Sig Dispense Refill  . amphetamine-dextroamphetamine (ADDERALL XR) 20 MG 24 hr capsule Take 1 capsule (20 mg total) by mouth every morning. (Patient not taking: Reported on 06/25/2017) 30 capsule 0   No facility-administered medications prior to visit.     ROS Review of Systems  All other systems reviewed and are negative.   Objective:  BP 112/62 (BP Location: Left Arm, Patient Position: Sitting, Cuff Size: Normal)   Pulse 88   Temp 98.4 F (36.9 C) (Oral)   Resp 16   Ht  (1.753 m)   Wt 165 lb (74.8 kg)   SpO2 96%   BMI 24.37 kg/m   BP Readings from Last 3 Encounters:  06/25/17 112/62  08/06/16 100/60  06/10/16 132/82    Wt Readings from Last 3 Encounters:  06/25/17 165 lb (74.8 kg)  08/06/16 150 lb 12 oz (68.4 kg)  06/10/16 155 lb 6.4 oz (70.5 kg)    Physical Exam  Psychiatric: He has a normal mood and affect. His behavior is normal. Judgment and thought content normal.  Vitals reviewed.   Lab Results  Component Value Date   WBC 6.8 07/27/2015   HGB 15.4 07/27/2015   HCT 46.2 07/27/2015   PLT 209.0 07/27/2015   GLUCOSE 89 07/27/2015   CHOL 165 08/06/2016   TRIG 61.0 08/06/2016   HDL 46.00 08/06/2016   LDLCALC 107 (H) 08/06/2016   ALT 13 07/27/2015   AST 12 07/27/2015   NA 141 07/27/2015   K 4.4 07/27/2015   CL 104 07/27/2015   CREATININE 0.89 07/27/2015   BUN 12 07/27/2015   CO2 31 07/27/2015   TSH 1.53 07/27/2015    No results found.  Assessment & Plan:   Stephen Williams was seen today for adhd.  Diagnoses and all orders for this visit:  Attention deficit hyperactivity disorder (ADHD), predominantly inattentive type- He will stop taking Adderall.  We will transition him to atomoxetine.  Will start a low dose and increase the dose over time.  I anticipate eventually increasing the dose to 80 mg a day. -     atomoxetine (STRATTERA) 10 MG capsule; Take 1 capsule (10 mg total) by mouth daily for 7 days. -     atomoxetine (STRATTERA) 18 MG capsule; Take 1 capsule (18 mg total) by mouth daily for 7 days. -     atomoxetine (STRATTERA) 25 MG capsule; Take 1 capsule (25 mg total) by mouth daily for 7 days. -     atomoxetine (STRATTERA) 40 MG capsule; Take 1 capsule (40 mg total) by mouth daily.   I have discontinued Stephen Williams "ANDREW"'s amphetamine-dextroamphetamine. I am also having him start on atomoxetine, atomoxetine, atomoxetine, and atomoxetine.  Meds ordered this encounter  Medications  . atomoxetine (STRATTERA) 10 MG capsule    Sig: Take 1 capsule (10 mg total) by mouth daily for 7 days.  Dispense:  7 capsule    Refill:  0  . atomoxetine (STRATTERA) 18 MG capsule    Sig: Take 1 capsule (18 mg total) by mouth daily for 7 days.    Dispense:  7 capsule    Refill:  0  . atomoxetine (STRATTERA) 25 MG capsule    Sig: Take 1 capsule (25 mg total) by mouth daily for 7 days.    Dispense:  7 capsule    Refill:  0  . atomoxetine (STRATTERA) 40 MG capsule    Sig: Take 1 capsule (40 mg total) by mouth daily.    Dispense:  30 capsule    Refill:  1     Follow-up: Return if symptoms worsen or fail to improve.  Sanda Linger, MD

## 2017-11-08 ENCOUNTER — Other Ambulatory Visit: Payer: Self-pay

## 2017-11-08 ENCOUNTER — Emergency Department (HOSPITAL_COMMUNITY)
Admission: EM | Admit: 2017-11-08 | Discharge: 2017-11-08 | Disposition: A | Discharge status: Home / Self Care | Payer: BLUE CROSS/BLUE SHIELD | Attending: Emergency Medicine | Admitting: Emergency Medicine

## 2017-11-08 ENCOUNTER — Encounter (HOSPITAL_COMMUNITY): Payer: Self-pay | Admitting: *Deleted

## 2017-11-08 DIAGNOSIS — Z79899 Other long term (current) drug therapy: Secondary | ICD-10-CM | POA: Insufficient documentation

## 2017-11-08 DIAGNOSIS — F900 Attention-deficit hyperactivity disorder, predominantly inattentive type: Secondary | ICD-10-CM | POA: Diagnosis not present

## 2017-11-08 DIAGNOSIS — F17210 Nicotine dependence, cigarettes, uncomplicated: Secondary | ICD-10-CM | POA: Diagnosis not present

## 2017-11-08 DIAGNOSIS — T7840XA Allergy, unspecified, initial encounter: Secondary | ICD-10-CM

## 2017-11-08 DIAGNOSIS — R0600 Dyspnea, unspecified: Secondary | ICD-10-CM | POA: Diagnosis present

## 2017-11-08 MED ORDER — EPINEPHRINE 0.3 MG/0.3ML IJ SOAJ
0.3000 mg | Freq: Once | INTRAMUSCULAR | Status: DC
  Filled 2017-11-08: qty 0.3

## 2017-11-08 MED ORDER — ALBUTEROL SULFATE HFA 108 (90 BASE) MCG/ACT IN AERS: 2.0000 | INHALATION_SPRAY | RESPIRATORY_TRACT | 0 refills | Status: DC | PRN

## 2017-11-08 MED ORDER — DIPHENHYDRAMINE HCL 50 MG/ML IJ SOLN
25.0000 mg | Freq: Once | INTRAMUSCULAR | Status: AC
  Administered 2017-11-08: 25 mg via INTRAVENOUS
  Filled 2017-11-08: qty 1

## 2017-11-08 MED ORDER — METHYLPREDNISOLONE SODIUM SUCC 125 MG IJ SOLR
125.0000 mg | Freq: Once | INTRAMUSCULAR | Status: AC
  Administered 2017-11-08: 125 mg via INTRAVENOUS
  Filled 2017-11-08: qty 2

## 2017-11-08 MED ORDER — EPINEPHRINE 0.3 MG/0.3ML IJ SOAJ: 0.3000 mg | Freq: Once | INTRAMUSCULAR | 1 refills | Status: AC

## 2017-11-08 MED ORDER — FAMOTIDINE IN NACL 20-0.9 MG/50ML-% IV SOLN
20.0000 mg | Freq: Once | INTRAVENOUS | Status: AC
  Administered 2017-11-08: 20 mg via INTRAVENOUS
  Filled 2017-11-08: qty 50

## 2017-11-08 MED ORDER — ALBUTEROL SULFATE HFA 108 (90 BASE) MCG/ACT IN AERS
2.0000 | INHALATION_SPRAY | Freq: Once | RESPIRATORY_TRACT | Status: AC
  Administered 2017-11-08: 2 via RESPIRATORY_TRACT
  Filled 2017-11-08: qty 6.7

## 2017-11-08 NOTE — ED Provider Notes (Signed)
[facility]-EMERGENCY DEPT Provider Note   CSN: [id number] Arrival date & time: 11/08/17  0230     History   Chief Complaint Chief Complaint  Patient presents with  . Allergic Reaction    HPI Stephen Williams is a 22 y.o. male.  The history is provided by the patient and a parent.  Allergic Reaction  Presenting symptoms: difficulty breathing and difficulty swallowing   Presenting symptoms: no itching, no rash, no swelling and no wheezing   Severity:  Moderate Prior allergic episodes:  Animal allergies Context comment:  Cat exposure Relieved by:  Nothing Worsened by:  Nothing Ineffective treatments:  Antihistamines Patient reports he is visiting his parents from college.  He was exposed to a cat, and has been having difficulty breathing and difficulty swallowing over the past several hours..  No rash.  No vomiting or diarrhea.  No syncope. He gave himself Benadryl at home.  He has not used EpiPen before Past Medical History:  Diagnosis Date  . Acne   . Seasonal allergies     Patient Active Problem List   Diagnosis Date Noted  . Attention deficit hyperactivity disorder (ADHD), predominantly inattentive type 07/27/2015  . Routine general medical examination at a health care facility 07/27/2015  . Acne   . Seasonal allergies     History reviewed. No pertinent surgical history.      Home Medications    Prior to Admission medications   Medication Sig Start Date End Date Taking? Authorizing Provider  atomoxetine (STRATTERA) 10 MG capsule Take 1 capsule (10 mg total) by mouth daily for 7 days. 06/25/17 07/02/17  Etta Grandchild, MD  atomoxetine (STRATTERA) 18 MG capsule Take 1 capsule (18 mg total) by mouth daily for 7 days. 06/25/17 07/02/17  Etta Grandchild, MD  atomoxetine (STRATTERA) 25 MG capsule Take 1 capsule (25 mg total) by mouth daily for 7 days. 06/25/17 07/02/17  Etta Grandchild, MD  atomoxetine (STRATTERA) 40 MG capsule Take 1 capsule (40  mg total) by mouth daily. 06/25/17   Etta Grandchild, MD    Family History Family History  Problem Relation Age of Onset  . Cancer Paternal Grandfather     Social History Social History   Tobacco Use  . Smoking status: Current Some Day Smoker    Types: Cigarettes    Last attempt to quit: 05/26/2017    Years since quitting: 0.4  . Smokeless tobacco: Never Used  Substance Use Topics  . Alcohol use: Yes    Alcohol/week: 6.0 standard drinks    Types: 6 Cans of beer per week  . Drug use: Yes    Frequency: 1.0 times per week    Types: Marijuana     Allergies   Patient has no known allergies.   Review of Systems Review of Systems  Constitutional: Negative for fever.  HENT: Positive for trouble swallowing.   Respiratory: Negative for wheezing.   Gastrointestinal: Negative for vomiting.  Skin: Negative for itching and rash.  Neurological: Negative for syncope.  All other systems reviewed and are negative.    Physical Exam Updated Vital Signs BP 138/85 (BP Location: Left Arm)   Pulse 88   Temp 97.8 F (36.6 C) (Oral)   Resp 20   Ht 1.753 m (5\' 9" )   Wt 70.3 kg   SpO2 95%   BMI 22.89 kg/m   Physical Exam CONSTITUTIONAL: Well developed/well nourished HEAD: Normocephalic/atraumatic EYES: EOMI/PERRL ENMT: Mucous membranes moist no angioedema, uvula midline without  edema or erythema, no drooling, no stridor NECK: supple no meningeal signs SPINE/BACK:entire spine nontender CV: S1/S2 noted, no murmurs/rubs/gallops noted LUNGS: Lungs are clear to auscultation bilaterally, no apparent distress ABDOMEN: soft, nontender, no rebound or guarding, bowel sounds noted throughout abdomen GU:no cva tenderness NEURO: Pt is awake/alert/appropriate, moves all extremitiesx4.  No facial droop.   EXTREMITIES: pulses normal/equal, full ROM SKIN: warm, color normal no rash noted PSYCH: no abnormalities of mood noted, alert and oriented to situation   ED Treatments / Results   Labs (all labs ordered are listed, but only abnormal results are displayed) Labs Reviewed - No data to display  EKG None  Radiology No results found.  Procedures Procedures    Medications Ordered in ED Medications  EPINEPHrine (EPI-PEN) injection 0.3 mg (0 mg Intramuscular Hold 11/08/17 0332)  diphenhydrAMINE (BENADRYL) injection 25 mg (25 mg Intravenous Given 11/08/17 0332)  methylPREDNISolone sodium succinate (SOLU-MEDROL) 125 mg/2 mL injection 125 mg (125 mg Intravenous Given 11/08/17 0331)  famotidine (PEPCID) IVPB 20 mg premix (20 mg Intravenous New Bag/Given 11/08/17 0332)  albuterol (PROVENTIL HFA;VENTOLIN HFA) 108 (90 Base) MCG/ACT inhaler 2 puff (2 puffs Inhalation Given 11/08/17 0339)     Initial Impression / Assessment and Plan / ED Course  I have reviewed the triage vital signs and the nursing notes.      3:52 AM Clinically patient is in no distress without any objective signs of anaphylaxis.  He insists that he is having difficulty breathing and difficulty swallowing.  I advised EpiPen, but he declines. Mother reports the patient has had good success with albuterol inhaler previously 5:46 AM Patient feeling improved.  No acute distress.  He would like to be discharged home.  He did not require EpiPen here, but this will be prescribed for him. Again there is no angioedema, no acute distress. We discussed appropriate use of EpiPen, he will be discharged home  Final Clinical Impressions(s) / ED Diagnoses   Final diagnoses:  Allergic reaction, initial encounter    ED Discharge Orders         Ordered    EPINEPHrine 0.3 mg/0.3 mL IJ SOAJ injection   Once     11/08/17 0518    albuterol (PROVENTIL HFA;VENTOLIN HFA) 108 (90 Base) MCG/ACT inhaler  Every 2 hours PRN     11/08/17 0518           Zadie Rhine, MD 11/08/17 [phone]

## 2017-11-08 NOTE — ED Triage Notes (Signed)
Pt c/o difficulty breathing, feels like throat is closing, have had to use Epi pen before.

## 2019-01-12 ENCOUNTER — Encounter: Payer: Self-pay | Admitting: Internal Medicine

## 2019-01-13 ENCOUNTER — Encounter: Payer: BLUE CROSS/BLUE SHIELD | Admitting: Internal Medicine

## 2019-01-14 ENCOUNTER — Encounter: Payer: Self-pay | Admitting: Internal Medicine

## 2019-01-14 ENCOUNTER — Ambulatory Visit (INDEPENDENT_AMBULATORY_CARE_PROVIDER_SITE_OTHER): Payer: BLUE CROSS/BLUE SHIELD | Admitting: Internal Medicine

## 2019-01-14 ENCOUNTER — Other Ambulatory Visit: Payer: Self-pay

## 2019-01-14 VITALS — BP 124/70 | HR 69 | Temp 98.1°F | Resp 16 | Ht 69.0 in | Wt 173.0 lb

## 2019-01-14 DIAGNOSIS — Z0000 Encounter for general adult medical examination without abnormal findings: Secondary | ICD-10-CM

## 2019-01-14 DIAGNOSIS — J3081 Allergic rhinitis due to animal (cat) (dog) hair and dander: Secondary | ICD-10-CM

## 2019-01-14 DIAGNOSIS — Z23 Encounter for immunization: Secondary | ICD-10-CM

## 2019-01-14 MED ORDER — ALBUTEROL SULFATE HFA 108 (90 BASE) MCG/ACT IN AERS: 2.0000 | INHALATION_SPRAY | Freq: Four times a day (QID) | RESPIRATORY_TRACT | 5 refills | Status: AC | PRN

## 2019-01-14 MED ORDER — EPINEPHRINE 0.3 MG/0.3ML IJ SOAJ: 0.3000 mg | INTRAMUSCULAR | 5 refills | Status: AC | PRN

## 2019-01-14 NOTE — Progress Notes (Signed)
Subjective:  Patient ID: Stephen Williams, male    DOB: [birth year]  Age: 23 y.o. MRN: [id number]  CC: Annual Exam and Allergic Reaction  This visit occurred during the SARS-CoV-2 public health emergency.  Safety protocols were in place, including screening questions prior to the visit, additional usage of staff PPE, and extensive cleaning of exam room while observing appropriate contact time as indicated for disinfecting solutions.   HPI Stephen Williams presents for a CPX.  He tells me that he saw a psychiatrist in [place] about 3 days ago and has started taking sertraline.  He hopes this will help with anxiety and depression.  He is no longer taking Strattera.  Over the last month or 2 he has had a couple of allergic reactions to cats.  He is occasionally exposed to cats in his household and has had some episodes of throat closing and wheezing.  He says the symptoms get better with albuterol inhaler and when he gets away from the cat dander.  He feels well today and offers no complaints.  Outpatient Medications Prior to Visit  Medication Sig Dispense Refill  . sertraline (ZOLOFT) 50 MG tablet TAKE 1/2 TABLET BY MOUTH EVERY MORNING FOR 4 DAYS THEN 1 EVERY MORNING    . albuterol (PROVENTIL HFA;VENTOLIN HFA) 108 (90 Base) MCG/ACT inhaler Inhale 2 puffs into the lungs every 2 (two) hours as needed for wheezing or shortness of breath (cough). 1 Inhaler 0  . atomoxetine (STRATTERA) 10 MG capsule Take 1 capsule (10 mg total) by mouth daily for 7 days. 7 capsule 0  . atomoxetine (STRATTERA) 18 MG capsule Take 1 capsule (18 mg total) by mouth daily for 7 days. 7 capsule 0  . atomoxetine (STRATTERA) 25 MG capsule Take 1 capsule (25 mg total) by mouth daily for 7 days. 7 capsule 0   No facility-administered medications prior to visit.    ROS Review of Systems  Constitutional: Negative.  Negative for chills, fatigue and fever.  HENT: Negative.  Negative for sore throat.   Eyes: Negative.     Respiratory: Negative for cough, chest tightness, shortness of breath and wheezing.   Cardiovascular: Negative for chest pain, palpitations and leg swelling.  Gastrointestinal: Negative for abdominal pain, constipation, diarrhea, nausea and vomiting.  Endocrine: Negative.   Genitourinary: Negative.  Negative for difficulty urinating, scrotal swelling and testicular pain.  Musculoskeletal: Negative.   Skin: Negative.  Negative for rash.  Neurological: Negative.   Hematological: Negative for adenopathy. Does not bruise/bleed easily.  Psychiatric/Behavioral: Negative.     Objective:  BP 124/70 (BP Location: Left Arm, Patient Position: Sitting, Cuff Size: Normal)   Pulse 69   Temp 98.1 F (36.7 C) (Oral)   Resp 16   Ht 5\' 9"  (1.753 m)   Wt 173 lb (78.5 kg)   SpO2 96%   BMI 25.55 kg/m   BP Readings from Last 3 Encounters:  01/14/19 124/70  11/08/17 112/69  06/25/17 112/62    Wt Readings from Last 3 Encounters:  01/14/19 173 lb (78.5 kg)  11/08/17 155 lb (70.3 kg)  06/25/17 165 lb (74.8 kg)    Physical Exam Vitals reviewed.  Constitutional:      Appearance: Normal appearance.  HENT:     Nose: Nose normal.     Mouth/Throat:     Mouth: Mucous membranes are moist.  Eyes:     General: No scleral icterus.    Conjunctiva/sclera: Conjunctivae normal.  Cardiovascular:     Rate and Rhythm:  Normal rate and regular rhythm.     Heart sounds: No murmur.  Pulmonary:     Effort: Pulmonary effort is normal.     Breath sounds: No stridor. No wheezing, rhonchi or rales.  Abdominal:     General: Abdomen is flat.     Palpations: There is no hepatomegaly, splenomegaly or mass.     Tenderness: There is no abdominal tenderness. There is no guarding.  Musculoskeletal:        General: Normal range of motion.     Cervical back: Neck supple.     Right lower leg: No edema.     Left lower leg: No edema.  Lymphadenopathy:     Cervical: No cervical adenopathy.  Skin:    General: Skin is  warm and dry.     Coloration: Skin is not pale.     Findings: No rash.  Neurological:     General: No focal deficit present.     Mental Status: He is alert.  Psychiatric:        Mood and Affect: Mood normal.        Behavior: Behavior normal.     Lab Results  Component Value Date   WBC 6.8 07/27/2015   HGB 15.4 07/27/2015   HCT 46.2 07/27/2015   PLT 209.0 07/27/2015   GLUCOSE 89 07/27/2015   CHOL 165 08/06/2016   TRIG 61.0 08/06/2016   HDL 46.00 08/06/2016   LDLCALC 107 (H) 08/06/2016   ALT 13 07/27/2015   AST 12 07/27/2015   NA 141 07/27/2015   K 4.4 07/27/2015   CL 104 07/27/2015   CREATININE 0.89 07/27/2015   BUN 12 07/27/2015   CO2 31 07/27/2015   TSH 1.53 07/27/2015    No results found.  Assessment & Plan:   Stephen Williams was seen today for annual exam and allergic reaction.  Diagnoses and all orders for this visit:  Routine general medical examination at a health care facility- Exam completed, no labs are indicated, vaccines reviewed and updated, patient education was given.  Need for influenza vaccination -     Flu Vaccine QUAD 36+ mos IM  Cat allergy, airborne- I recommended that he keep an EpiPen on hand in the event that he has an anaphylactic reaction to cat dander.  He will continue to use the albuterol inhaler as needed. -     EPINEPHrine 0.3 mg/0.3 mL IJ SOAJ injection; Inject 0.3 mLs (0.3 mg total) into the muscle as needed for anaphylaxis. -     albuterol (VENTOLIN HFA) 108 (90 Base) MCG/ACT inhaler; Inhale 2 puffs into the lungs every 6 (six) hours as needed for wheezing or shortness of breath (cough).   I have discontinued Stephen Hew "ANDREW"'s atomoxetine, atomoxetine, and atomoxetine. I have also changed his albuterol. Additionally, I am having him start on EPINEPHrine. Lastly, I am having him maintain his sertraline.  Meds ordered this encounter  Medications  . EPINEPHrine 0.3 mg/0.3 mL IJ SOAJ injection    Sig: Inject 0.3 mLs (0.3 mg total)  into the muscle as needed for anaphylaxis.    Dispense:  2 each    Refill:  5  . albuterol (VENTOLIN HFA) 108 (90 Base) MCG/ACT inhaler    Sig: Inhale 2 puffs into the lungs every 6 (six) hours as needed for wheezing or shortness of breath (cough).    Dispense:  18 g    Refill:  5     Follow-up: No follow-ups on file.  Sanda Linger, MD

## 2019-01-14 NOTE — Patient Instructions (Signed)
Anaphylactic Reaction, Adult An anaphylactic reaction (anaphylaxis) is a sudden, serious allergic reaction. This affects more than one part of your body. It can be life-threatening. If you have an anaphylactic reaction, you need to get medical help right away. What are the causes? This condition is caused by exposure to things that give you an allergic reaction (allergens). Common allergens include:  Foods, such as peanuts, wheat, shellfish, milk, and eggs.  Medicines.  Insect bites or stings.  Blood or parts of blood received for treatment (transfusions).  Chemicals, such as latex and dyes that are used in food and in medical tests. What are the signs or symptoms? Signs of an anaphylactic reaction may include:  Feeling warm in the face (flushed). Your face may turn red.  Itchy, red, swollen areas of skin (hives).  Swelling of the: ? Eyes. ? Lips. ? Face. ? Mouth. ? Tongue. ? Throat.  Trouble with any of these: ? Breathing. ? Talking. ? Swallowing.  Loud breathing (wheezing).  Feeling dizzy or light-headed.  Passing out (fainting).  Pain or cramps in your belly.  Throwing up (vomiting).  Watery poop (diarrhea). How is this diagnosed? This condition is diagnosed based on:  Your symptoms.  A physical exam.  Blood tests.  Recent exposure to things that give you an allergic reaction. How is this treated? If you think you are having an anaphylactic reaction, you should do this right away:  Give yourself a shot of medicine (epinephrine) using an auto-injector "pen." Your doctor will teach you how to use this pen.  Call for emergency help. If you use a pen, you must still get treated in the hospital. There, you may be given: ? Medicines. ? Oxygen. ? Fluids in an IV tube. Follow these instructions at home: Safety  Always keep an auto-injector pen with you. This could save your life. Use it as told by your doctor.  Do not drive after a reaction. Wait until  your doctor says it is safe to drive.  Make sure that you, the people who live with you, and your employer know: ? What you are allergic to, so you can stay away from it. ? How to use your auto-injector pen.  Wear a bracelet or necklace that says you have an allergy, if your doctor tells you to do this.  Learn the signs of a very bad allergic reaction. This way, you can treat it right away.  Work with your doctors to make a plan for what to do if you have a very bad reaction. It is important to be ready. If you use your auto-injector pen:   Get more medicine (epinephrine) for your pen right away. This is important in case you have another reaction.  Get help right away. To avoid a serious allergic reaction:  Avoid things that gave you a very bad allergic reaction before.  Tell your server about your allergy when you go out to eat. If you are not sure if your meal has food that you are allergic to, ask your server before you eat it. General instructions  Take over-the-counter and prescription medicines only as told by your doctor.  If you have itchy, red, swollen areas of skin or a rash: ? Use an over-the-counter medicine (antihistamine) as told by your doctor. ? Put cold, wet cloths on your skin. ? Take a cool bath or shower. Avoid hot water.  Tell all doctors who care for you that you have an allergy.  Keep all follow-up visits  as told by your doctor. This is important. Get help right away if:  You have signs of an allergic reaction. You may notice them soon after being exposed to things that give you an allergic reaction. Signs may include: ? Warmth in your face. Your face may turn red. ? Itchy, red, swollen areas of skin. ? Swelling of your:  Eyes.  Lips.  Face.  Mouth.  Tongue.  Throat. ? Trouble with any of these:  Breathing.  Talking.  Swallowing. ? Loud breathing (wheezing). ? Feeling dizzy or light-headed. ? Passing out. ? Pain or cramps in your  belly. ? Throwing up. ? Watery poop.  You had to use your auto-injector pen. You must go to the emergency room even if the medicine seems to be working. This is because another allergic reaction may happen within 3 days (rebound anaphylaxis). These symptoms may be an emergency. Do not wait to see if the symptoms will go away. Do this right away:  Use your auto-injector pen as you have been told.  Get medical help. Call your local emergency services (911 in the [place] Do not drive yourself to the hospital. Summary  An anaphylactic reaction (anaphylaxis) is a sudden, serious allergic reaction.  This condition can be life-threatening. If you have a reaction, get medical help right away.  Your doctor will show you how to give yourself a shot (epinephrine injection) with an auto-injector "pen."  Always keep an auto-injector pen with you. It could save your life. Use it as told by your doctor.  If you had to use your auto-injector pen, you must go to the emergency room. Go there even if the medicine seems to be working. This information is not intended to replace advice given to you by your health care provider. Make sure you discuss any questions you have with your health care provider. Document Released: 07/03/2007 Document Revised: 05/08/2017 Document Reviewed: 05/08/2017 Elsevier Patient Education  2020 [employer].

## 2019-01-18 ENCOUNTER — Other Ambulatory Visit: Payer: Self-pay

## 2019-01-18 ENCOUNTER — Ambulatory Visit: Payer: BLUE CROSS/BLUE SHIELD | Attending: Internal Medicine

## 2019-01-18 DIAGNOSIS — Z20822 Contact with and (suspected) exposure to covid-19: Secondary | ICD-10-CM

## 2019-01-19 LAB — NOVEL CORONAVIRUS, NAA: SARS-CoV-2, NAA: NOT DETECTED

## 2020-01-10 ENCOUNTER — Encounter: Payer: Self-pay | Admitting: Internal Medicine

## 2020-01-10 ENCOUNTER — Ambulatory Visit (INDEPENDENT_AMBULATORY_CARE_PROVIDER_SITE_OTHER): Payer: 59 | Admitting: Internal Medicine

## 2020-01-10 ENCOUNTER — Other Ambulatory Visit: Payer: Self-pay

## 2020-01-10 VITALS — BP 110/78 | HR 78 | Temp 98.3°F | Resp 16 | Ht 69.0 in | Wt 171.0 lb

## 2020-01-10 DIAGNOSIS — Z23 Encounter for immunization: Secondary | ICD-10-CM | POA: Diagnosis not present

## 2020-01-10 DIAGNOSIS — Z0000 Encounter for general adult medical examination without abnormal findings: Secondary | ICD-10-CM

## 2020-01-10 DIAGNOSIS — Z0001 Encounter for general adult medical examination with abnormal findings: Secondary | ICD-10-CM

## 2020-01-10 NOTE — Progress Notes (Signed)
Subjective:  Patient ID: Stephen Williams, male    DOB: 10-09-1995  Age: 24 y.o. MRN: [id number]  CC: Annual Exam  This visit occurred during the SARS-CoV-2 public health emergency.  Safety protocols were in place, including screening questions prior to the visit, additional usage of staff PPE, and extensive cleaning of exam room while observing appropriate contact time as indicated for disinfecting solutions.    HPI Stephen Williams presents for a CPX.  He tells me that he saw a psychiatrist earlier today and has decided to restart sertraline. He tells me he is no longer being treated for ADHD. He has otherwise felt well recently and offers no complaints.  Outpatient Medications Prior to Visit  Medication Sig Dispense Refill  . albuterol (VENTOLIN HFA) 108 (90 Base) MCG/ACT inhaler Inhale 2 puffs into the lungs every 6 (six) hours as needed for wheezing or shortness of breath (cough). 18 g 5  . EPINEPHrine 0.3 mg/0.3 mL IJ SOAJ injection Inject 0.3 mLs (0.3 mg total) into the muscle as needed for anaphylaxis. 2 each 5  . sertraline (ZOLOFT) 50 MG tablet TAKE 1/2 TABLET BY MOUTH EVERY MORNING FOR 4 DAYS THEN 1 EVERY MORNING     No facility-administered medications prior to visit.    ROS Review of Systems  Constitutional: Negative for appetite change, fatigue and unexpected weight change.  HENT: Negative.   Eyes: Negative for visual disturbance.  Respiratory: Negative for cough, chest tightness, shortness of breath and wheezing.   Cardiovascular: Negative for chest pain, palpitations and leg swelling.  Gastrointestinal: Negative for abdominal pain, constipation, diarrhea, nausea and vomiting.  Endocrine: Negative.   Genitourinary: Negative.  Negative for difficulty urinating, penile swelling, scrotal swelling and testicular pain.  Musculoskeletal: Negative.  Negative for arthralgias and myalgias.  Skin: Negative.   Neurological: Negative.  Negative for dizziness, weakness  and light-headedness.  Hematological: Negative for adenopathy. Does not bruise/bleed easily.  Psychiatric/Behavioral: Negative.     Objective:  BP 110/78 (BP Location: Left Arm, Patient Position: Sitting, Cuff Size: Large)   Pulse 78   Temp 98.3 F (36.8 C) (Oral)   Resp 16   Ht 5\' 9"  (1.753 m)   Wt 171 lb (77.6 kg)   SpO2 98%   BMI 25.25 kg/m   BP Readings from Last 3 Encounters:  01/10/20 110/78  01/14/19 124/70  11/08/17 112/69    Wt Readings from Last 3 Encounters:  01/10/20 171 lb (77.6 kg)  01/14/19 173 lb (78.5 kg)  11/08/17 155 lb (70.3 kg)    Physical Exam Vitals reviewed.  HENT:     Nose: Nose normal.     Mouth/Throat:     Mouth: Mucous membranes are moist.  Eyes:     General: No scleral icterus.    Conjunctiva/sclera: Conjunctivae normal.  Cardiovascular:     Rate and Rhythm: Normal rate and regular rhythm.     Heart sounds: No murmur heard.   Pulmonary:     Effort: Pulmonary effort is normal.     Breath sounds: No stridor. No wheezing, rhonchi or rales.  Abdominal:     General: Abdomen is flat. Bowel sounds are normal. There is no distension.     Palpations: Abdomen is soft. There is no hepatomegaly, splenomegaly or mass.     Tenderness: There is no abdominal tenderness.  Musculoskeletal:        General: Normal range of motion.     Cervical back: Neck supple.     Right lower leg:  No edema.     Left lower leg: No edema.  Lymphadenopathy:     Cervical: No cervical adenopathy.  Skin:    General: Skin is warm and dry.     Coloration: Skin is not pale.  Neurological:     General: No focal deficit present.     Mental Status: He is alert.  Psychiatric:        Mood and Affect: Mood normal.        Behavior: Behavior normal.     Lab Results  Component Value Date   WBC 6.8 07/27/2015   HGB 15.4 07/27/2015   HCT 46.2 07/27/2015   PLT 209.0 07/27/2015   GLUCOSE 89 07/27/2015   CHOL 165 08/06/2016   TRIG 61.0 08/06/2016   HDL 46.00  08/06/2016   LDLCALC 107 (H) 08/06/2016   ALT 13 07/27/2015   AST 12 07/27/2015   NA 141 07/27/2015   K 4.4 07/27/2015   CL 104 07/27/2015   CREATININE 0.89 07/27/2015   BUN 12 07/27/2015   CO2 31 07/27/2015   TSH 1.53 07/27/2015    No results found.  Assessment & Plan:   Stephen Williams was seen today for annual exam.  Diagnoses and all orders for this visit:  Encounter for general adult medical examination with abnormal findings- Exam completed, no labs are indicated, vaccines reviewed and updated - He agrees to get a COVID-19 vaccine the day after this appointment, patient education material was given.  Other orders -     Flu Vaccine QUAD 6+ mos PF IM (Fluarix Quad PF)   I am having Stephen Williams "Stephen Williams" maintain his sertraline, EPINEPHrine, and albuterol.  No orders of the defined types were placed in this encounter.    Follow-up: Return in about 6 months (around 07/10/2020).  Sanda Linger, MD

## 2020-01-10 NOTE — Patient Instructions (Signed)

## 2020-01-11 DIAGNOSIS — Z0001 Encounter for general adult medical examination with abnormal findings: Secondary | ICD-10-CM | POA: Insufficient documentation

## 2024-01-08 ENCOUNTER — Encounter: Payer: Self-pay | Admitting: Behavioral Health

## 2024-01-08 ENCOUNTER — Ambulatory Visit: Payer: Self-pay | Admitting: Behavioral Health

## 2024-01-08 VITALS — BP 140/88 | HR 80 | Ht 68.0 in | Wt 205.0 lb

## 2024-01-08 DIAGNOSIS — F411 Generalized anxiety disorder: Secondary | ICD-10-CM

## 2024-01-08 DIAGNOSIS — F331 Major depressive disorder, recurrent, moderate: Secondary | ICD-10-CM

## 2024-01-08 DIAGNOSIS — F900 Attention-deficit hyperactivity disorder, predominantly inattentive type: Secondary | ICD-10-CM | POA: Diagnosis not present

## 2024-01-08 MED ORDER — SERTRALINE HCL 50 MG PO TABS: ORAL_TABLET | ORAL | 1 refills | Status: AC

## 2024-01-08 NOTE — Progress Notes (Signed)
 [facility] MD/PA/NP Initial Note  01/08/2024 10:50 AM [name]  MRN:  [id number]  Chief Complaint:  Chief Complaint   Anxiety; Depression; Follow-up; Patient Education; Stress     HPI:  Stephen Williams, 28 year old male presents to this office for initial visit and to establish care.  Collateral information should be considered reliable.  Patient is pleasant and calm but somewhat flat.  States that he has struggled with anxiety, depression, and ADHD since his early years in college.  He has previously seen 2 other providers for psychiatric care in the past.  Says that he lost his job approximately 3 months ago and has been struggling with depression and anxiety.  Says that he is interested he and going back on medication.  Says that he realizes that he feels better when he is taking his medication as prescribed.  Reports sometimes stopping the medication too soon.  Says that sertraline has previously worked well for him and that his father also takes the medication for same.  Says that his anxiety and depression are both 3/10 today but says that it can go much higher depending on the given day.  He is sleeping 7 to 8 hours per night.  PHQ 2 was negative.  MDQ was negative.  He did endorse periods of racing thoughts, and lack of concentration.  No family history of bipolar disorder.  Reports some weight gain.  Appetite is good.  Reports some negative family dynamics.  States that he does not have good family support at this time.  Although he does believe that his family relationships are repairable.  Patient states that he feels safe and verbally contracts for safety with this clinical research associate.  Denies any history of mania, no psychosis, no auditory or visual hallucinations.  No SI or HI.  Visit Diagnosis:    ICD-10-CM   1. Attention deficit hyperactivity disorder (ADHD), predominantly inattentive type  F90.0     2. Generalized anxiety disorder  F41.1 sertraline (ZOLOFT) 50 MG tablet    3. Major  depressive disorder, recurrent episode, moderate (HCC)  F33.1 sertraline (ZOLOFT) 50 MG tablet      Past Psychiatric History: ADHD, Depression, Anxiety  Past Medical History:  Past Medical History:  Diagnosis Date   Acne    Seasonal allergies    History reviewed. No pertinent surgical history.  Family Psychiatric History: see chart  Family History:  Family History  Problem Relation Age of Onset   Depression Mother    Anxiety disorder Mother    Depression Brother    Anxiety disorder Brother    Cancer Paternal Grandfather     Social History:  Social History   Socioeconomic History   Marital status: Single    Spouse name: n/a   Number of children: 0   Years of education: 16   Highest education level: Bachelor's degree (e.g., BA, AB, BS)  Occupational History   Occupation: student    Comment: ; [employer] graduate  Tobacco Use   Smoking status: Some Days    Current packs/day: 0.00    Types: Cigarettes    Last attempt to quit: 05/26/2017    Years since quitting: 6.6   Smokeless tobacco: Never  Vaping Use   Vaping status: Some Days  Substance and Sexual Activity   Alcohol use: Yes    Alcohol/week: 6.0 standard drinks of alcohol    Types: 6 Cans of beer per week   Drug use: Yes    Frequency: 1.0 times per week  Types: Marijuana    Comment: [place]   Sexual activity: Not Currently  Other Topics Concern   Not on file  Social History Narrative   Living alone in apartment in [place]. Reports strained relationship with family.   Social Drivers of Health   Tobacco Use: High Risk (01/08/2024)   Patient History    Smoking Tobacco Use: Some Days    Smokeless Tobacco Use: Never    Passive Exposure: Not on file  Financial Resource Strain: Not on file  Food Insecurity: Not on file  Transportation Needs: Not on file  Physical Activity: Not on file  Stress: Not on file  Social Connections: Not on file  Depression (PHQ2-9): Low Risk (01/08/2024)    Depression (PHQ2-9)    PHQ-2 Score: 1  Alcohol Screen: Not on file  Housing: Not on file  Utilities: Not on file  Health Literacy: Not on file    Allergies: Allergies[1]  Metabolic Disorder Labs: No results found for: HGBA1C, MPG No results found for: PROLACTIN Lab Results  Component Value Date   CHOL 165 08/06/2016   TRIG 61.0 08/06/2016   HDL 46.00 08/06/2016   CHOLHDL 4 08/06/2016   VLDL 12.2 08/06/2016   LDLCALC 107 (H) 08/06/2016   LDLCALC 136 (H) 07/27/2015   Lab Results  Component Value Date   TSH 1.53 07/27/2015    Therapeutic Level Labs: No results found for: LITHIUM No results found for: VALPROATE No results found for: CBMZ  Current Medications: Current Outpatient Medications  Medication Sig Dispense Refill   sertraline (ZOLOFT) 50 MG tablet Take 1/2 tablet by mouth for 7 days, then one whole tablet daily. 30 tablet 1   albuterol  (VENTOLIN  HFA) 108 (90 Base) MCG/ACT inhaler Inhale 2 puffs into the lungs every 6 (six) hours as needed for wheezing or shortness of breath (cough). 18 g 5   EPINEPHrine  0.3 mg/0.3 mL IJ SOAJ injection Inject 0.3 mLs (0.3 mg total) into the muscle as needed for anaphylaxis. 2 each 5   sertraline (ZOLOFT) 50 MG tablet TAKE 1/2 TABLET BY MOUTH EVERY MORNING FOR 4 DAYS THEN 1 EVERY MORNING     No current facility-administered medications for this visit.    Medication Side Effects: none  Orders placed this visit:  No orders of the defined types were placed in this encounter.   Psychiatric Specialty Exam:  Review of Systems  HENT:  Positive for ear pain and tinnitus.   Neurological:  Positive for headaches.    Blood pressure (!) 140/88, pulse 80, height 5' 8 (1.727 m), weight 205 lb (93 kg).Body mass index is 31.17 kg/m.  General Appearance: Casual, Neat, and Well Groomed  Eye Contact:  Good  Speech:  Clear and Coherent  Volume:  Decreased  Mood:  Anxious and Depressed  Affect:  Depressed and Anxious  Thought  Process:  Coherent  Orientation:  Full (Time, Place, and Person)  Thought Content: Logical   Suicidal Thoughts:  No  Homicidal Thoughts:  No  Memory:  WNL  Judgement:  Good  Insight:  Good  Psychomotor Activity:  Normal  Concentration:  Concentration: Good  Recall:  Good  Fund of Knowledge: Good  Language: Good  Assets:  Desire for Improvement  ADL's:  Intact  Cognition: WNL  Prognosis:  Good   Screenings:  PHQ2-9    Flowsheet Row Office Visit from 01/08/2024 in [facility] Office Visit from 01/10/2020 in [facility] [place] Office Visit from 06/25/2017 in [facility] Office Visit from 11/24/2014 in [facility] at [facility] Total Score 1 0 0 0    Receiving Psychotherapy: No   Treatment Plan/Recommendations:  Greater than 50% of 60 min face to face time with patient was spent on counseling and coordination of care. We discussed his long history of depression, anxiety and ADHD stemming back to his junior year in college.  Discussed his previous plan of care with other providers as well as prior psychotropic medication list.  Talked about family dynamics and his desire for increased social interaction.  We agreed today to: Will reinitiate Zoloft 25 mg for 7 days, then 50 mg daily after breakfast. Will report worsening symptoms or side effects promptly To follow-up in this office in 4 weeks to reassess Provided emergency contact information Reviewed PDMP   Redell DELENA Pizza, NP              [1] No Known Allergies

## 2024-02-10 ENCOUNTER — Ambulatory Visit: Admitting: Behavioral Health

## 2024-02-26 ENCOUNTER — Encounter: Payer: Self-pay | Admitting: Behavioral Health

## 2024-02-26 ENCOUNTER — Ambulatory Visit: Admitting: Behavioral Health

## 2024-02-26 DIAGNOSIS — F331 Major depressive disorder, recurrent, moderate: Secondary | ICD-10-CM | POA: Diagnosis not present

## 2024-02-26 DIAGNOSIS — F900 Attention-deficit hyperactivity disorder, predominantly inattentive type: Secondary | ICD-10-CM | POA: Diagnosis not present

## 2024-02-26 DIAGNOSIS — F411 Generalized anxiety disorder: Secondary | ICD-10-CM

## 2024-02-26 MED ORDER — SERTRALINE HCL 100 MG PO TABS: 100.0000 mg | ORAL_TABLET | Freq: Every day | ORAL | 1 refills | Status: AC

## 2024-02-26 NOTE — Progress Notes (Signed)
 "     [facility] Med Check  Patient ID: Stephen Williams,  MRN: [id number]  PCP: Joshua Debby CROME, MD  Date of Evaluation: 02/26/2024 Time spent:20 minutes  Chief Complaint:  Chief Complaint   Depression; Anxiety; Follow-up; Medication Refill; Patient Education; Stress     HISTORY/CURRENT STATUS: HPI Stephen Williams, 29 year old male presents to this office for follow up and medication management.  Collateral information should be considered reliable.  PT is not as flat as last visit and appears a little bit more positive.  Says that he is starting to feel better but not where he would like to be.  Would like to consider increasing his Zoloft  this visit.  Says that his depression is 4/10, and anxiety is 2/10.  He is sleeping 7 to 8 hours per night.  Appetite is good.     Patient states that he feels safe and verbally contracts for safety with this clinical research associate.  Denies any history of mania, no psychosis, no auditory or visual hallucinations.  No SI or HI.        Individual Medical History/ Review of Systems: Changes? :No   Allergies: Patient has no known allergies.  Current Medications: Current Medications[1] Medication Side Effects: none  Family Medical/ Social History: Changes? No  MENTAL HEALTH EXAM:  There were no vitals taken for this visit.There is no height or weight on file to calculate BMI.  General Appearance: Casual, Neat, and Well Groomed  Eye Contact:  Good  Speech:  Clear and Coherent  Volume:  Normal  Mood:  NA  Affect:  Appropriate  Thought Process:  Coherent  Orientation:  Full (Time, Place, and Person)  Thought Content: Logical   Suicidal Thoughts:  No  Homicidal Thoughts:  No  Memory:  WNL  Judgement:  Good  Insight:  Good  Psychomotor Activity:  Normal  Concentration:  Concentration: Good  Recall:  Good  Fund of Knowledge: Good  Language: Good  Assets:  Desire for Improvement  ADL's:  Intact  Cognition: WNL  Prognosis:  Good    DIAGNOSES:     ICD-10-CM   1. Major depressive disorder, recurrent episode, moderate (HCC)  F33.1     2. Generalized anxiety disorder  F41.1     3. Attention deficit hyperactivity disorder (ADHD), predominantly inattentive type  F90.0       Receiving Psychotherapy: No    RECOMMENDATIONS:  Greater than 50% of 20 min face to face time with patient was spent on counseling and coordination of care.  We discussed his good improvement since last visit.  However he is not quite where he would like to be.  Still reporting some mild depression and anxiety.  Requesting medication adjustment this visit.   We agreed today to: To increase Zoloft  to 100 mg daily. Will report worsening symptoms or side effects promptly To follow-up in this office in 8 weeks to reassess Provided emergency contact information Reviewed PDMP  Redell DELENA Pizza, NP      [1]  Current Outpatient Medications:    albuterol  (VENTOLIN  HFA) 108 (90 Base) MCG/ACT inhaler, Inhale 2 puffs into the lungs every 6 (six) hours as needed for wheezing or shortness of breath (cough)., Disp: 18 g, Rfl: 5   EPINEPHrine  0.3 mg/0.3 mL IJ SOAJ injection, Inject 0.3 mLs (0.3 mg total) into the muscle as needed for anaphylaxis., Disp: 2 each, Rfl: 5   sertraline  (ZOLOFT ) 50 MG tablet, TAKE 1/2 TABLET BY MOUTH EVERY MORNING FOR 4 DAYS THEN 1 EVERY MORNING,  Disp: , Rfl:    sertraline  (ZOLOFT ) 50 MG tablet, Take 1/2 tablet by mouth for 7 days, then one whole tablet daily., Disp: 30 tablet, Rfl: 1  "

## 2024-04-22 ENCOUNTER — Ambulatory Visit: Admitting: Behavioral Health
